# Patient Record
Sex: Female | Born: 1995 | Race: White | Hispanic: No | Marital: Single | Smoking: Never smoker
Health system: Southern US, Community
[De-identification: ages and names within clinical notes are randomized; demographics above are authoritative.]

## PROBLEM LIST (undated history)

## (undated) ENCOUNTER — Inpatient Hospital Stay (HOSPITAL_COMMUNITY): Payer: Self-pay

---
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
## 2014-05-11 ENCOUNTER — Encounter (HOSPITAL_COMMUNITY): Payer: Self-pay

## 2014-06-13 ENCOUNTER — Encounter (HOSPITAL_COMMUNITY): Payer: Self-pay | Admitting: *Deleted

## 2015-01-21 IMAGING — US US OB COMP LESS 14 WK
1 series · 14 of 21 positions shown · non-contrast
Comparison: None.

CLINICAL DATA: New onset bleeding. Estimated gestational age by
last menstrual period equals 10 weeks 6 days.

EXAM:
OBSTETRIC <14 WK ULTRASOUND
TECHNIQUE: Transabdominal ultrasound was performed for evaluation of the
gestation as well as the maternal uterus and adnexal regions.

[Series 1: us ob transvaginal · 21 acquisitions, 14 frames shown]
[im 1/21]
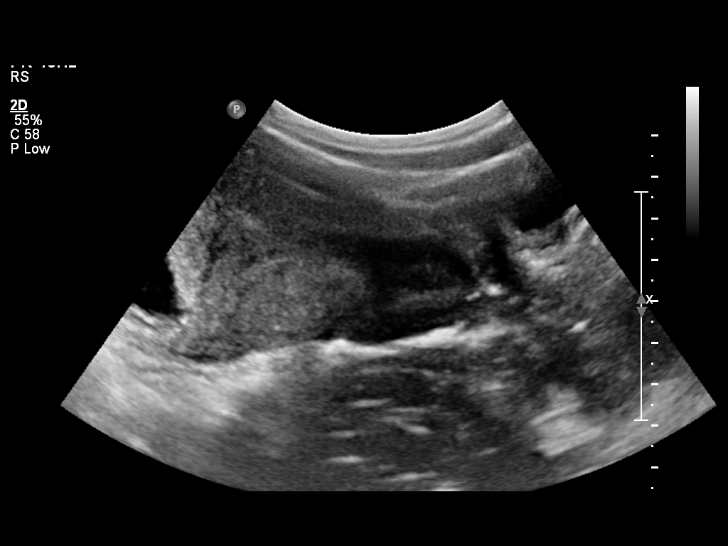
[im 3/21]
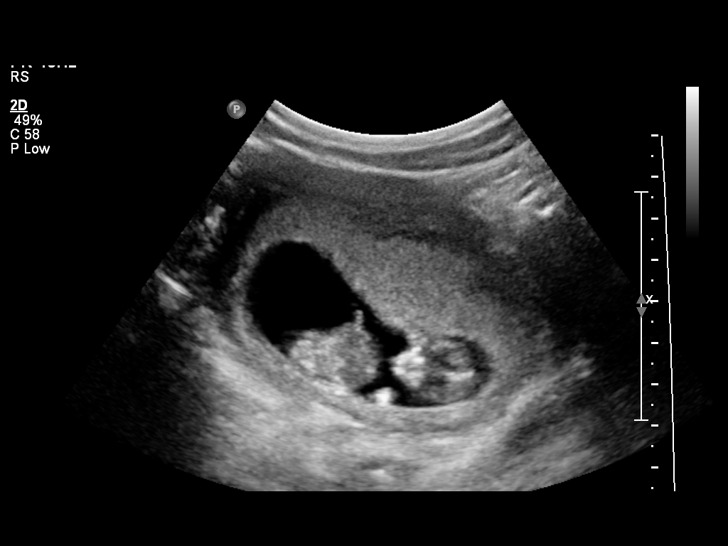
[im 4/21]
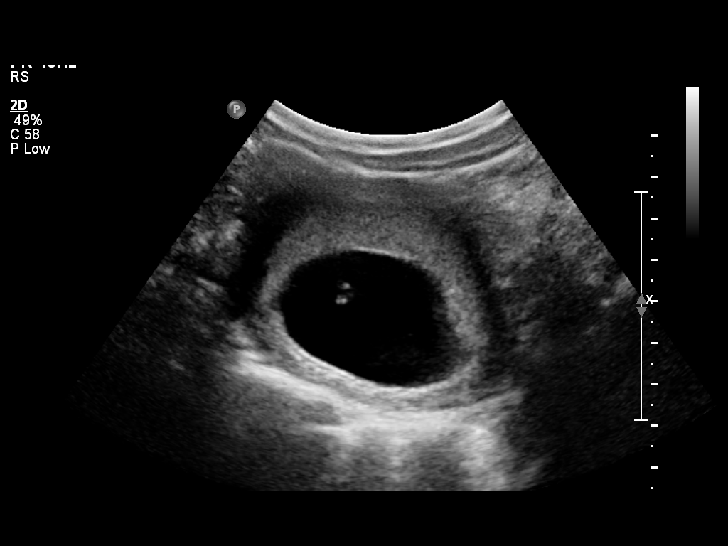
[im 6/21]
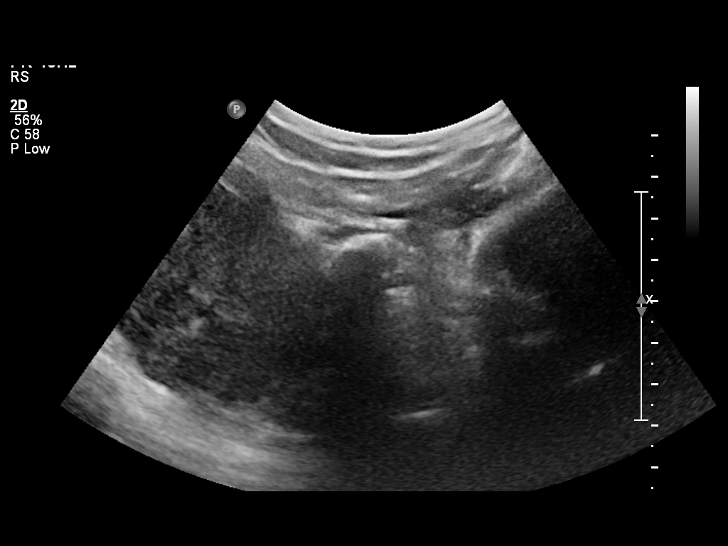
[im 7/21]
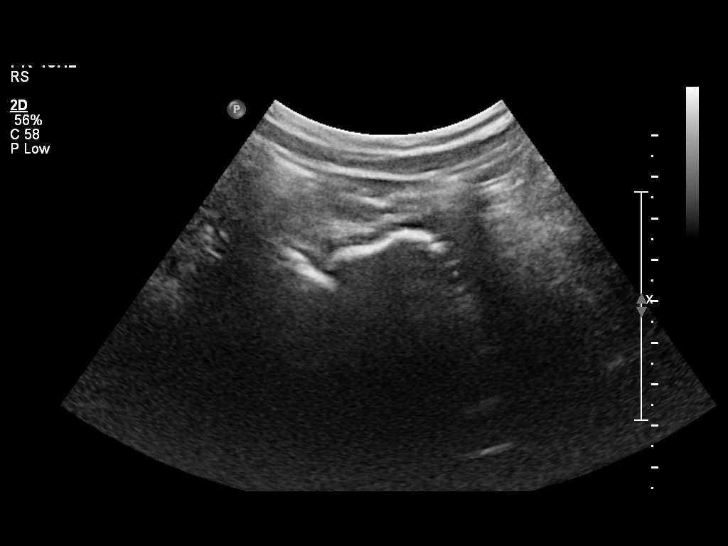
[im 9/21]
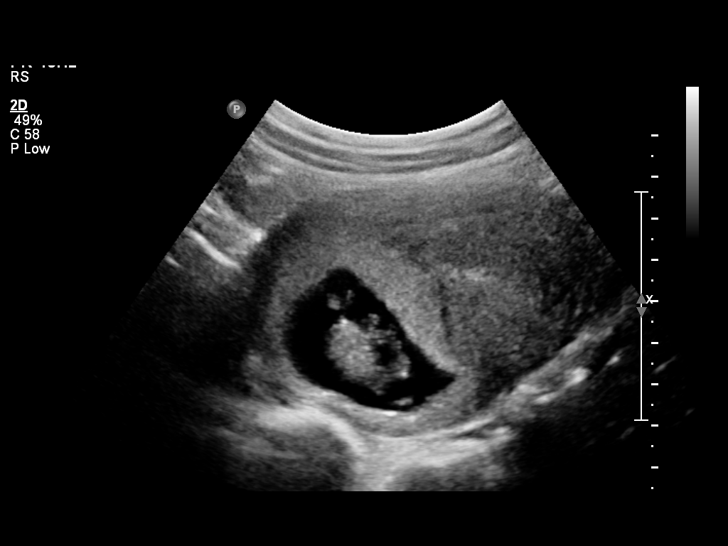
[im 10/21]
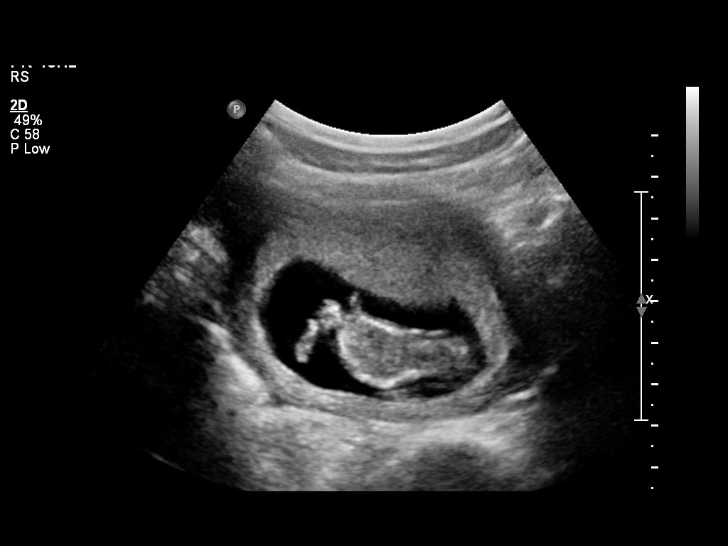
[im 12/21]
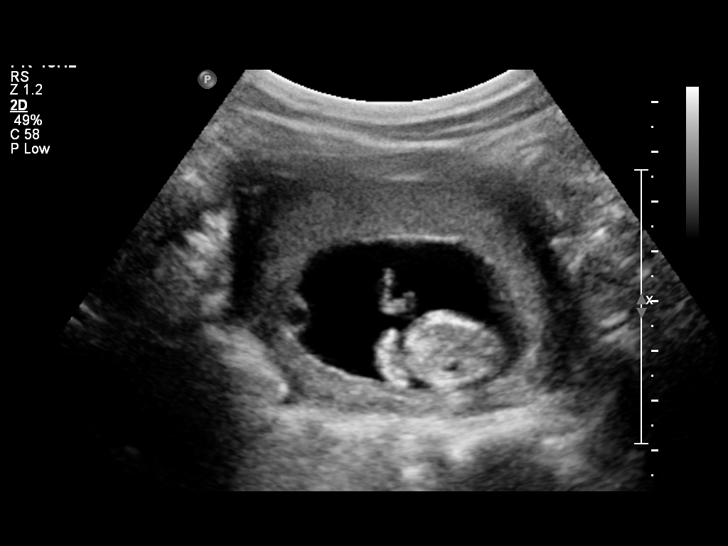
[im 13/21]
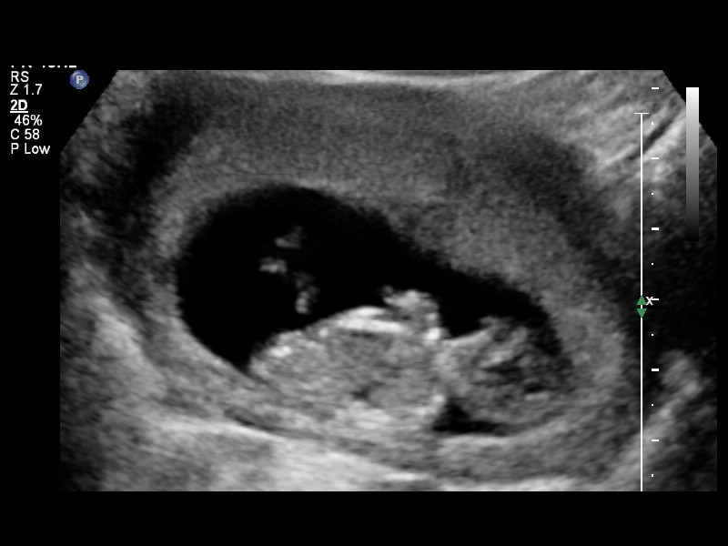
[im 15/21]
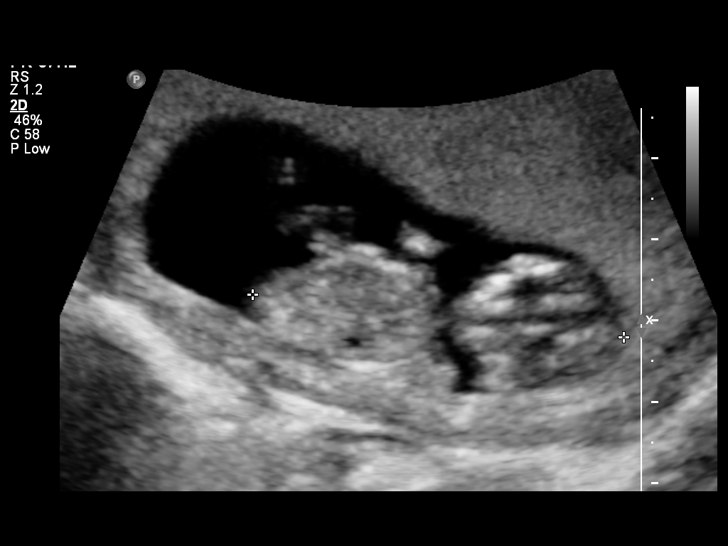
[im 16/21]
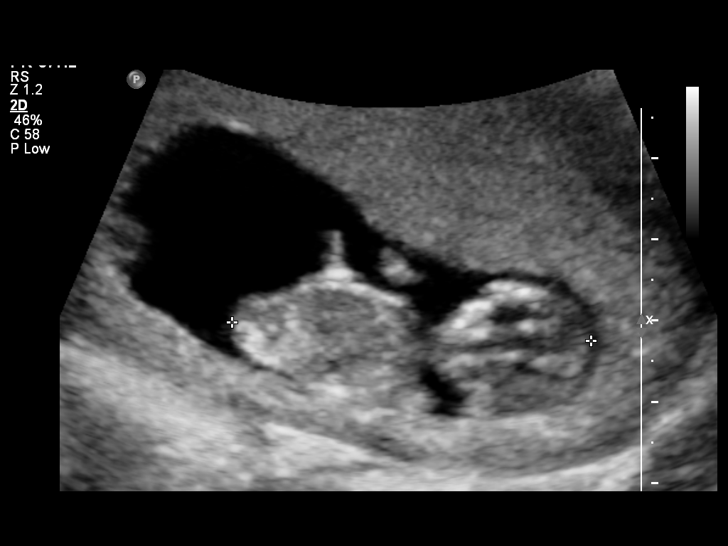
[im 18/21]
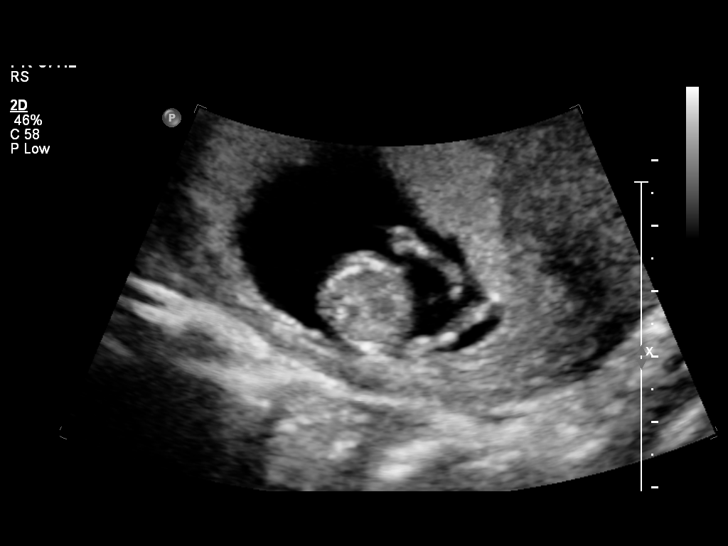
[im 19/21]
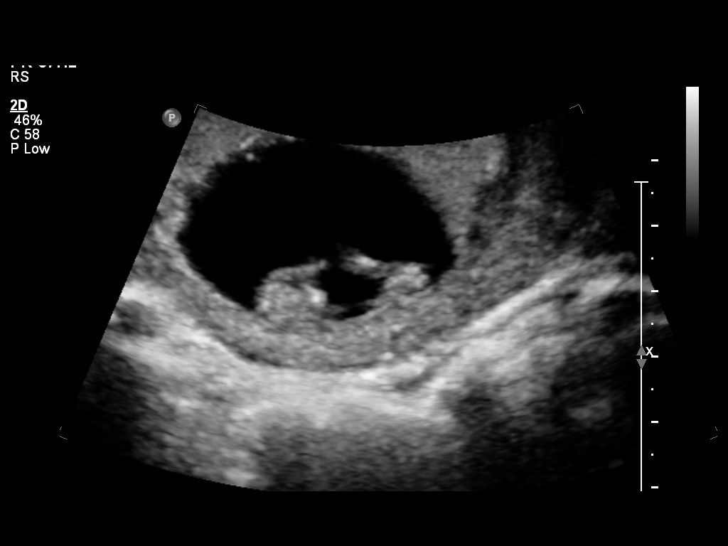
[im 21/21]
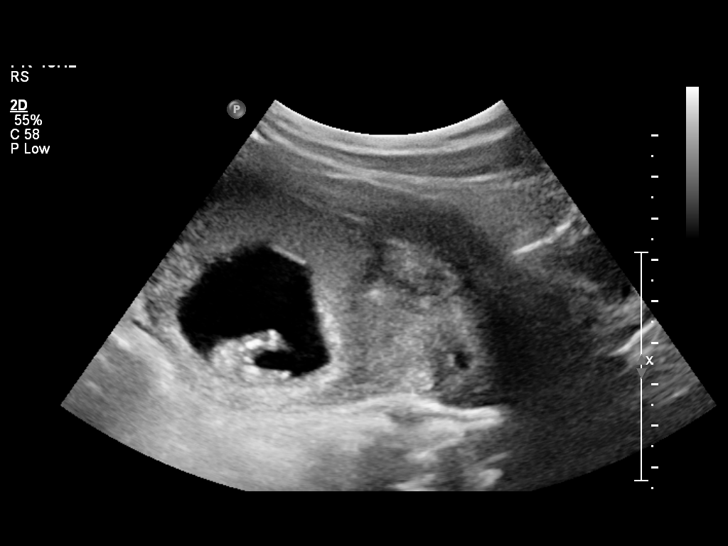

[14 of 21 positions shown; findings below may reference images not displayed]

FINDINGS: Intrauterine gestational sac: Single

Yolk sac:  Present

Embryo:  Present

Cardiac Activity: Present

Heart Rate: 161 bpm

CRL:   46  mm   11 w 3d                  US EDC: 02/24/2014

Maternal uterus/adnexae: Corpus luteal cyst in the right ovary. No
free fluid. No subchorionic hemorrhage.
IMPRESSION: 1. Single intrauterine gestation with embryo and normal cardiac
activity.

2. Estimated gestational age by crown rump length equals 11 weeks 3
days..

## 2020-02-25 ENCOUNTER — Telehealth: Payer: Self-pay | Admitting: *Deleted

## 2020-02-25 NOTE — Telephone Encounter (Signed)
Left patient a message to call and schdeule an appointment if she is still in need. Patient nor referral coordinator from [facility] had called to schedule as stated on the faxed referral on 01/30/2020.
